# Patient Record
Sex: Male | Born: 1945 | Hispanic: No | Marital: Married | Smoking: Never smoker
Health system: Southern US, Community
[De-identification: ages and names within clinical notes are randomized; demographics above are authoritative.]

## PROBLEM LIST (undated history)

## (undated) DIAGNOSIS — I10 Essential (primary) hypertension: Secondary | ICD-10-CM

## (undated) DIAGNOSIS — E119 Type 2 diabetes mellitus without complications: Secondary | ICD-10-CM

## (undated) HISTORY — PX: PROSTATE SURGERY: SHX751

---

## 2014-03-08 ENCOUNTER — Encounter (HOSPITAL_COMMUNITY): Payer: Self-pay | Admitting: Family Medicine

## 2014-03-08 ENCOUNTER — Emergency Department (INDEPENDENT_AMBULATORY_CARE_PROVIDER_SITE_OTHER)
Admission: EM | Admit: 2014-03-08 | Discharge: 2014-03-08 | Disposition: A | Discharge status: Home / Self Care | Payer: PRIVATE HEALTH INSURANCE | Source: Home / Self Care | Attending: Family Medicine | Admitting: Family Medicine

## 2014-03-08 DIAGNOSIS — E119 Type 2 diabetes mellitus without complications: Secondary | ICD-10-CM

## 2014-03-08 DIAGNOSIS — M5431 Sciatica, right side: Secondary | ICD-10-CM

## 2014-03-08 HISTORY — DX: Type 2 diabetes mellitus without complications: E11.9

## 2014-03-08 HISTORY — DX: Essential (primary) hypertension: I10

## 2014-03-08 MED ORDER — METHOCARBAMOL 500 MG PO TABS
500.0000 mg | ORAL_TABLET | Freq: Four times a day (QID) | ORAL | Status: AC | PRN
Start: 2014-03-08 — End: ?

## 2014-03-08 MED ORDER — DEXAMETHASONE SODIUM PHOSPHATE 10 MG/ML IJ SOLN
10.0000 mg | Freq: Once | INTRAMUSCULAR | Status: AC
  Administered 2014-03-08: 10 mg via INTRAMUSCULAR

## 2014-03-08 MED ORDER — PREDNISONE (PAK) 10 MG PO TABS
ORAL_TABLET | ORAL | Status: DC
Start: 2014-03-08 — End: 2014-03-16

## 2014-03-08 MED ORDER — DEXAMETHASONE SODIUM PHOSPHATE 10 MG/ML IJ SOLN
INTRAMUSCULAR | Status: AC
  Filled 2014-03-08: qty 1

## 2014-03-08 NOTE — Discharge Instructions (Signed)
You likely have sciatica You were given a shot of steroids in the office to help with the inflammation and pain Please start your daily exercises, apply heat or ice, and try to massage your lower back Stay activeSciatica with Rehab The sciatic nerve runs from the back down the leg and is responsible for sensation and control of the muscles in the back (posterior) side of the thigh, lower leg, and foot. Sciatica is a condition that is characterized by inflammation of this nerve.  SYMPTOMS   Signs of nerve damage, including numbness and/or weakness along the posterior side of the lower extremity.  Pain in the back of the thigh that may also travel down the leg.  Pain that worsens when sitting for long periods of time.  Occasionally, pain in the back or buttock. CAUSES  Inflammation of the sciatic nerve is the cause of sciatica. The inflammation is due to something irritating the nerve. Common sources of irritation include:  Sitting for long periods of time.  Direct trauma to the nerve.  Arthritis of the spine.  Herniated or ruptured disk.  Slipping of the vertebrae (spondylolisthesis).  Pressure from soft tissues, such as muscles or ligament-like tissue (fascia). RISK INCREASES WITH:  Sports that place pressure or stress on the spine (football or weightlifting).  Poor strength and flexibility.  Failure to warm up properly before activity.  Family history of low back pain or disk disorders.  Previous back injury or surgery.  Poor body mechanics, especially when lifting, or poor posture. PREVENTION   Warm up and stretch properly before activity.  Maintain physical fitness:  Strength, flexibility, and endurance.  Cardiovascular fitness.  Learn and use proper technique, especially with posture and lifting. When possible, have coach correct improper technique.  Avoid activities that place stress on the spine. PROGNOSIS If treated properly, then sciatica usually  resolves within 6 weeks. However, occasionally surgery is necessary.  RELATED COMPLICATIONS   Permanent nerve damage, including pain, numbness, tingle, or weakness.  Chronic back pain.  Risks of surgery: infection, bleeding, nerve damage, or damage to surrounding tissues. TREATMENT Treatment initially involves resting from any activities that aggravate your symptoms. The use of ice and medication may help reduce pain and inflammation. The use of strengthening and stretching exercises may help reduce pain with activity. These exercises may be performed at home or with referral to a therapist. A therapist may recommend further treatments, such as transcutaneous electronic nerve stimulation (TENS) or ultrasound. Your caregiver may recommend corticosteroid injections to help reduce inflammation of the sciatic nerve. If symptoms persist despite non-surgical (conservative) treatment, then surgery may be recommended. MEDICATION  If pain medication is necessary, then nonsteroidal anti-inflammatory medications, such as aspirin and ibuprofen, or other minor pain relievers, such as acetaminophen, are often recommended.  Do not take pain medication for 7 days before surgery.  Prescription pain relievers may be given if deemed necessary by your caregiver. Use only as directed and only as much as you need.  Ointments applied to the skin may be helpful.  Corticosteroid injections may be given by your caregiver. These injections should be reserved for the most serious cases, because they may only be given a certain number of times. HEAT AND COLD  Cold treatment (icing) relieves pain and reduces inflammation. Cold treatment should be applied for 10 to 15 minutes every 2 to 3 hours for inflammation and pain and immediately after any activity that aggravates your symptoms. Use ice packs or massage the area with a piece  of ice (ice massage).  Heat treatment may be used prior to performing the stretching and  strengthening activities prescribed by your caregiver, physical therapist, or athletic trainer. Use a heat pack or soak the injury in warm water. SEEK MEDICAL CARE IF:  Treatment seems to offer no benefit, or the condition worsens.  Any medications produce adverse side effects. EXERCISES  RANGE OF MOTION (ROM) AND STRETCHING EXERCISES - Sciatica Most people with sciatic will find that their symptoms worsen with either excessive bending forward (flexion) or arching at the low back (extension). The exercises which will help resolve your symptoms will focus on the opposite motion. Your physician, physical therapist or athletic trainer will help you determine which exercises will be most helpful to resolve your low back pain. Do not complete any exercises without first consulting with your clinician. Discontinue any exercises which worsen your symptoms until you speak to your clinician. If you have pain, numbness or tingling which travels down into your buttocks, leg or foot, the goal of the therapy is for these symptoms to move closer to your back and eventually resolve. Occasionally, these leg symptoms will get better, but your low back pain may worsen; this is typically an indication of progress in your rehabilitation. Be certain to be very alert to any changes in your symptoms and the activities in which you participated in the 24 hours prior to the change. Sharing this information with your clinician will allow him/her to most efficiently treat your condition. These exercises may help you when beginning to rehabilitate your injury. Your symptoms may resolve with or without further involvement from your physician, physical therapist or athletic trainer. While completing these exercises, remember:   Restoring tissue flexibility helps normal motion to return to the joints. This allows healthier, less painful movement and activity.  An effective stretch should be held for at least 30 seconds.  A stretch  should never be painful. You should only feel a gentle lengthening or release in the stretched tissue. FLEXION RANGE OF MOTION AND STRETCHING EXERCISES: STRETCH - Flexion, Single Knee to Chest   Lie on a firm bed or floor with both legs extended in front of you.  Keeping one leg in contact with the floor, bring your opposite knee to your chest. Hold your leg in place by either grabbing behind your thigh or at your knee.  Pull until you feel a gentle stretch in your low back. Hold __________ seconds.  Slowly release your grasp and repeat the exercise with the opposite side. Repeat __________ times. Complete this exercise __________ times per day.  STRETCH - Flexion, Double Knee to Chest  Lie on a firm bed or floor with both legs extended in front of you.  Keeping one leg in contact with the floor, bring your opposite knee to your chest.  Tense your stomach muscles to support your back and then lift your other knee to your chest. Hold your legs in place by either grabbing behind your thighs or at your knees.  Pull both knees toward your chest until you feel a gentle stretch in your low back. Hold __________ seconds.  Tense your stomach muscles and slowly return one leg at a time to the floor. Repeat __________ times. Complete this exercise __________ times per day.  STRETCH - Low Trunk Rotation   Lie on a firm bed or floor. Keeping your legs in front of you, bend your knees so they are both pointed toward the ceiling and your feet are flat  on the floor.  Extend your arms out to the side. This will stabilize your upper body by keeping your shoulders in contact with the floor.  Gently and slowly drop both knees together to one side until you feel a gentle stretch in your low back. Hold for __________ seconds.  Tense your stomach muscles to support your low back as you bring your knees back to the starting position. Repeat the exercise to the other side. Repeat __________ times. Complete  this exercise __________ times per day  EXTENSION RANGE OF MOTION AND FLEXIBILITY EXERCISES: STRETCH - Extension, Prone on Elbows  Lie on your stomach on the floor, a bed will be too soft. Place your palms about shoulder width apart and at the height of your head.  Place your elbows under your shoulders. If this is too painful, stack pillows under your chest.  Allow your body to relax so that your hips drop lower and make contact more completely with the floor.  Hold this position for __________ seconds.  Slowly return to lying flat on the floor. Repeat __________ times. Complete this exercise __________ times per day.  RANGE OF MOTION - Extension, Prone Press Ups  Lie on your stomach on the floor, a bed will be too soft. Place your palms about shoulder width apart and at the height of your head.  Keeping your back as relaxed as possible, slowly straighten your elbows while keeping your hips on the floor. You may adjust the placement of your hands to maximize your comfort. As you gain motion, your hands will come more underneath your shoulders.  Hold this position __________ seconds.  Slowly return to lying flat on the floor. Repeat __________ times. Complete this exercise __________ times per day.  STRENGTHENING EXERCISES - Sciatica  These exercises may help you when beginning to rehabilitate your injury. These exercises should be done near your "sweet spot." This is the neutral, low-back arch, somewhere between fully rounded and fully arched, that is your least painful position. When performed in this safe range of motion, these exercises can be used for people who have either a flexion or extension based injury. These exercises may resolve your symptoms with or without further involvement from your physician, physical therapist or athletic trainer. While completing these exercises, remember:   Muscles can gain both the endurance and the strength needed for everyday activities through  controlled exercises.  Complete these exercises as instructed by your physician, physical therapist or athletic trainer. Progress with the resistance and repetition exercises only as your caregiver advises.  You may experience muscle soreness or fatigue, but the pain or discomfort you are trying to eliminate should never worsen during these exercises. If this pain does worsen, stop and make certain you are following the directions exactly. If the pain is still present after adjustments, discontinue the exercise until you can discuss the trouble with your clinician. STRENGTHENING - Deep Abdominals, Pelvic Tilt   Lie on a firm bed or floor. Keeping your legs in front of you, bend your knees so they are both pointed toward the ceiling and your feet are flat on the floor.  Tense your lower abdominal muscles to press your low back into the floor. This motion will rotate your pelvis so that your tail bone is scooping upwards rather than pointing at your feet or into the floor.  With a gentle tension and even breathing, hold this position for __________ seconds. Repeat __________ times. Complete this exercise __________ times per day.  STRENGTHENING - Abdominals, Crunches   Lie on a firm bed or floor. Keeping your legs in front of you, bend your knees so they are both pointed toward the ceiling and your feet are flat on the floor. Cross your arms over your chest.  Slightly tip your chin down without bending your neck.  Tense your abdominals and slowly lift your trunk high enough to just clear your shoulder blades. Lifting higher can put excessive stress on the low back and does not further strengthen your abdominal muscles.  Control your return to the starting position. Repeat __________ times. Complete this exercise __________ times per day.  STRENGTHENING - Quadruped, Opposite UE/LE Lift  Assume a hands and knees position on a firm surface. Keep your hands under your shoulders and your knees  under your hips. You may place padding under your knees for comfort.  Find your neutral spine and gently tense your abdominal muscles so that you can maintain this position. Your shoulders and hips should form a rectangle that is parallel with the floor and is not twisted.  Keeping your trunk steady, lift your right hand no higher than your shoulder and then your left leg no higher than your hip. Make sure you are not holding your breath. Hold this position __________ seconds.  Continuing to keep your abdominal muscles tense and your back steady, slowly return to your starting position. Repeat with the opposite arm and leg. Repeat __________ times. Complete this exercise __________ times per day.  STRENGTHENING - Abdominals and Quadriceps, Straight Leg Raise   Lie on a firm bed or floor with both legs extended in front of you.  Keeping one leg in contact with the floor, bend the other knee so that your foot can rest flat on the floor.  Find your neutral spine, and tense your abdominal muscles to maintain your spinal position throughout the exercise.  Slowly lift your straight leg off the floor about 6 inches for a count of 15, making sure to not hold your breath.  Still keeping your neutral spine, slowly lower your leg all the way to the floor. Repeat this exercise with each leg __________ times. Complete this exercise __________ times per day. POSTURE AND BODY MECHANICS CONSIDERATIONS - Sciatica Keeping correct posture when sitting, standing or completing your activities will reduce the stress put on different body tissues, allowing injured tissues a chance to heal and limiting painful experiences. The following are general guidelines for improved posture. Your physician or physical therapist will provide you with any instructions specific to your needs. While reading these guidelines, remember:  The exercises prescribed by your provider will help you have the flexibility and strength to  maintain correct postures.  The correct posture provides the optimal environment for your joints to work. All of your joints have less wear and tear when properly supported by a spine with good posture. This means you will experience a healthier, less painful body.  Correct posture must be practiced with all of your activities, especially prolonged sitting and standing. Correct posture is as important when doing repetitive low-stress activities (typing) as it is when doing a single heavy-load activity (lifting). RESTING POSITIONS Consider which positions are most painful for you when choosing a resting position. If you have pain with flexion-based activities (sitting, bending, stooping, squatting), choose a position that allows you to rest in a less flexed posture. You would want to avoid curling into a fetal position on your side. If your pain worsens with extension-based activities (  prolonged standing, working overhead), avoid resting in an extended position such as sleeping on your stomach. Most people will find more comfort when they rest with their spine in a more neutral position, neither too rounded nor too arched. Lying on a non-sagging bed on your side with a pillow between your knees, or on your back with a pillow under your knees will often provide some relief. Keep in mind, being in any one position for a prolonged period of time, no matter how correct your posture, can still lead to stiffness. PROPER SITTING POSTURE In order to minimize stress and discomfort on your spine, you must sit with correct posture Sitting with good posture should be effortless for a healthy body. Returning to good posture is a gradual process. Many people can work toward this most comfortably by using various supports until they have the flexibility and strength to maintain this posture on their own. When sitting with proper posture, your ears will fall over your shoulders and your shoulders will fall over your hips.  You should use the back of the chair to support your upper back. Your low back will be in a neutral position, just slightly arched. You may place a small pillow or folded towel at the base of your low back for support.  When working at a desk, create an environment that supports good, upright posture. Without extra support, muscles fatigue and lead to excessive strain on joints and other tissues. Keep these recommendations in mind: CHAIR:   A chair should be able to slide under your desk when your back makes contact with the back of the chair. This allows you to work closely.  The chair's height should allow your eyes to be level with the upper part of your monitor and your hands to be slightly lower than your elbows. BODY POSITION  Your feet should make contact with the floor. If this is not possible, use a foot rest.  Keep your ears over your shoulders. This will reduce stress on your neck and low back. INCORRECT SITTING POSTURES   If you are feeling tired and unable to assume a healthy sitting posture, do not slouch or slump. This puts excessive strain on your back tissues, causing more damage and pain. Healthier options include:  Using more support, like a lumbar pillow.  Switching tasks to something that requires you to be upright or walking.  Talking a brief walk.  Lying down to rest in a neutral-spine position. PROLONGED STANDING WHILE SLIGHTLY LEANING FORWARD  When completing a task that requires you to lean forward while standing in one place for a long time, place either foot up on a stationary 2-4 inch high object to help maintain the best posture. When both feet are on the ground, the low back tends to lose its slight inward curve. If this curve flattens (or becomes too large), then the back and your other joints will experience too much stress, fatigue more quickly and can cause pain.  CORRECT STANDING POSTURES Proper standing posture should be assumed with all daily  activities, even if they only take a few moments, like when brushing your teeth. As in sitting, your ears should fall over your shoulders and your shoulders should fall over your hips. You should keep a slight tension in your abdominal muscles to brace your spine. Your tailbone should point down to the ground, not behind your body, resulting in an over-extended swayback posture.  INCORRECT STANDING POSTURES  Common incorrect standing postures include a  forward head, locked knees and/or an excessive swayback. WALKING Walk with an upright posture. Your ears, shoulders and hips should all line-up. PROLONGED ACTIVITY IN A FLEXED POSITION When completing a task that requires you to bend forward at your waist or lean over a low surface, try to find a way to stabilize 3 of 4 of your limbs. You can place a hand or elbow on your thigh or rest a knee on the surface you are reaching across. This will provide you more stability so that your muscles do not fatigue as quickly. By keeping your knees relaxed, or slightly bent, you will also reduce stress across your low back. CORRECT LIFTING TECHNIQUES DO :   Assume a wide stance. This will provide you more stability and the opportunity to get as close as possible to the object which you are lifting.  Tense your abdominals to brace your spine; then bend at the knees and hips. Keeping your back locked in a neutral-spine position, lift using your leg muscles. Lift with your legs, keeping your back straight.  Test the weight of unknown objects before attempting to lift them.  Try to keep your elbows locked down at your sides in order get the best strength from your shoulders when carrying an object.  Always ask for help when lifting heavy or awkward objects. INCORRECT LIFTING TECHNIQUES DO NOT:   Lock your knees when lifting, even if it is a small object.  Bend and twist. Pivot at your feet or move your feet when needing to change directions.  Assume that you  cannot safely pick up a paperclip without proper posture. Document Released: 04/13/2005 Document Revised: 08/28/2013 Document Reviewed: 07/26/2008 [facility] Patient Information 2015 [place], [place]. This information is not intended to replace advice given to you by your health care provider. Make sure you discuss any questions you have with your health care provider. If you get significantly worse please seek medical attention at the emergency room Please delay your flight until this improves Please check your sugar regularly and increase your lantus by 1-5 units as needed for elevated sugars. Goal for your sugar should be between 100-200.  Please stop the the prednisone if your sugar gets above 300

## 2014-03-08 NOTE — ED Provider Notes (Signed)
CSN: [id number]     Arrival date & time 03/08/14  [id number] History   First MD Initiated Contact with Patient 03/08/14 1018     No chief complaint on file.  (Consider location/radiation/quality/duration/timing/severity/associated sxs/prior Treatment) HPI  [place] tonight. Tailbone pain. Started this morning when pt woke up. Associated w/ pain and stiffness radiating down R buttock to leg. Denies leg swelling. Feels weak in the legs and is unable to stand secondary to pain. No change in sensation or loss of bowel or bladder function. New pain for pt. No change recently in physical activity or recent falls.   Past Medical History  Diagnosis Date  . Diabetes mellitus without complication   . Hypertension    Past Surgical History  Procedure Laterality Date  . Prostate surgery     Family History  Problem Relation Age of Onset  . Diabetes Mother   . Hypertension Father   . Stroke Father    History  Substance Use Topics  . Smoking status: Never Smoker   . Smokeless tobacco: Not on file  . Alcohol Use: Yes     Comment: occ    Review of Systems Per HPI with all other pertinent systems negative.   Allergies  Review of patient's allergies indicates not on file.  Home Medications   Prior to Admission medications   Medication Sig Start Date End Date Taking? Authorizing Provider  methocarbamol (ROBAXIN) 500 MG tablet Take 1-2 tablets (500-1,000 mg total) by mouth every 6 (six) hours as needed for muscle spasms. 03/08/14   Ozella Rocksavid J Daleisa Halperin, MD  predniSONE (STERAPRED UNI-PAK) 10 MG tablet Take 60mg  by mouth daily with breakfast 03/08/14   Ozella Rocksavid J Takila Kronberg, MD   BP 131/84 mmHg  Pulse 106  Temp(Src) 98.6 F (37 C) (Oral)  Resp 12  SpO2 100% Physical Exam  Constitutional: He appears well-developed and well-nourished. No distress.  HENT:  Head: Normocephalic and atraumatic.  Eyes: EOM are normal. Pupils are equal, round, and reactive to light.   Neck: Normal range of motion.  Cardiovascular: Normal rate, normal heart sounds and intact distal pulses.   Pulmonary/Chest: Effort normal and breath sounds normal.  Abdominal: Soft.  Musculoskeletal:  R lower thoracic to lumbar perispinal tightness w/o ttp and no bony ttp. R leg flexion 5/5 and L 5/5. FROM of back and LE bilat. Sensation intact.   Skin: He is not diaphoretic.    ED Course  Procedures (including critical care time) Labs Review Labs Reviewed - No data to display  Imaging Review No results found.   MDM   1. Sciatica, right   2. Diabetes mellitus type 2 in nonobese    Decadron 10mg  IM in office Start prednisone at home tomorrow x 6 more days Robaxin Exercises, heat/ice, massage.  Pt to monitor sugars closely. Currently CBG 75 avg in am. Titrate lantus as needed 1-5 units to keep glucose in the 100-200 range fasting in the am. Stop prednisone if >200 and symptomatic.  Precautions given and all questions answered  Shelly Flattenavid Daina Cara, MD Family Medicine 03/08/2014, 11:14 AM  \     Ozella Rocksavid J Kewanna Kasprzak, MD 03/08/14 1115

## 2014-03-08 NOTE — ED Notes (Signed)
68 year old male brought to Urgent care by his family.  He is complaining of severe  Lower back pain.

## 2014-03-16 ENCOUNTER — Emergency Department (INDEPENDENT_AMBULATORY_CARE_PROVIDER_SITE_OTHER)
Admission: EM | Admit: 2014-03-16 | Discharge: 2014-03-16 | Disposition: A | Discharge status: Home / Self Care | Payer: PRIVATE HEALTH INSURANCE | Source: Home / Self Care | Attending: Family Medicine | Admitting: Family Medicine

## 2014-03-16 ENCOUNTER — Emergency Department (HOSPITAL_COMMUNITY): Payer: PRIVATE HEALTH INSURANCE

## 2014-03-16 ENCOUNTER — Emergency Department (HOSPITAL_COMMUNITY)
Admission: EM | Admit: 2014-03-16 | Discharge: 2014-03-16 | Disposition: A | Discharge status: Home / Self Care | Payer: PRIVATE HEALTH INSURANCE | Attending: Emergency Medicine | Admitting: Emergency Medicine

## 2014-03-16 ENCOUNTER — Encounter (HOSPITAL_COMMUNITY): Payer: Self-pay | Admitting: Emergency Medicine

## 2014-03-16 ENCOUNTER — Encounter (HOSPITAL_COMMUNITY): Payer: Self-pay | Admitting: Family Medicine

## 2014-03-16 DIAGNOSIS — E119 Type 2 diabetes mellitus without complications: Secondary | ICD-10-CM | POA: Insufficient documentation

## 2014-03-16 DIAGNOSIS — I10 Essential (primary) hypertension: Secondary | ICD-10-CM | POA: Diagnosis not present

## 2014-03-16 DIAGNOSIS — M549 Dorsalgia, unspecified: Secondary | ICD-10-CM | POA: Diagnosis not present

## 2014-03-16 DIAGNOSIS — Z79899 Other long term (current) drug therapy: Secondary | ICD-10-CM | POA: Insufficient documentation

## 2014-03-16 DIAGNOSIS — M5126 Other intervertebral disc displacement, lumbar region: Secondary | ICD-10-CM

## 2014-03-16 DIAGNOSIS — R29898 Other symptoms and signs involving the musculoskeletal system: Secondary | ICD-10-CM

## 2014-03-16 DIAGNOSIS — R531 Weakness: Secondary | ICD-10-CM

## 2014-03-16 DIAGNOSIS — M5186 Other intervertebral disc disorders, lumbar region: Secondary | ICD-10-CM | POA: Diagnosis not present

## 2014-03-16 DIAGNOSIS — Z7952 Long term (current) use of systemic steroids: Secondary | ICD-10-CM | POA: Insufficient documentation

## 2014-03-16 DIAGNOSIS — M5432 Sciatica, left side: Secondary | ICD-10-CM

## 2014-03-16 LAB — COMPREHENSIVE METABOLIC PANEL
ALK PHOS: 69 U/L (ref 39–117)
ALT: 21 U/L (ref 0–53)
AST: 16 U/L (ref 0–37)
Albumin: 3.5 g/dL (ref 3.5–5.2)
Anion gap: 17 — ABNORMAL HIGH (ref 5–15)
BUN: 16 mg/dL (ref 6–23)
CO2: 20 mEq/L (ref 19–32)
Calcium: 9 mg/dL (ref 8.4–10.5)
Chloride: 87 mEq/L — ABNORMAL LOW (ref 96–112)
Creatinine, Ser: 0.69 mg/dL (ref 0.50–1.35)
GFR calc Af Amer: >90 mL/min (ref >90)
GFR calc non Af Amer: >90 mL/min (ref >90)
Glucose, Bld: 194 mg/dL — ABNORMAL HIGH (ref 70–99)
POTASSIUM: 4.1 meq/L (ref 3.7–5.3)
SODIUM: 124 meq/L — AB (ref 137–147)
Total Bilirubin: 0.7 mg/dL (ref 0.3–1.2)
Total Protein: 6.4 g/dL (ref 6.0–8.3)

## 2014-03-16 LAB — CBC WITH DIFFERENTIAL/PLATELET
BASOS ABS: 0 10*3/uL (ref 0.0–0.1)
Basophils Relative: 0 % (ref 0–1)
EOS ABS: 0.1 10*3/uL (ref 0.0–0.7)
Eosinophils Relative: 1 % (ref 0–5)
HCT: 36.1 % — ABNORMAL LOW (ref 39.0–52.0)
Hemoglobin: 12.7 g/dL — ABNORMAL LOW (ref 13.0–17.0)
Lymphocytes Relative: 25 % (ref 12–46)
Lymphs Abs: 2.1 10*3/uL (ref 0.7–4.0)
MCH: 29.5 pg (ref 26.0–34.0)
MCHC: 35.2 g/dL (ref 30.0–36.0)
MCV: 84 fL (ref 78.0–100.0)
Monocytes Absolute: 0.6 10*3/uL (ref 0.1–1.0)
Monocytes Relative: 7 % (ref 3–12)
NEUTROS PCT: 67 % (ref 43–77)
Neutro Abs: 5.8 10*3/uL (ref 1.7–7.7)
PLATELETS: 248 10*3/uL (ref 150–400)
RBC: 4.3 MIL/uL (ref 4.22–5.81)
RDW: 13 % (ref 11.5–15.5)
WBC: 8.6 10*3/uL (ref 4.0–10.5)

## 2014-03-16 LAB — CBG MONITORING, ED: Glucose-Capillary: 136 mg/dL — ABNORMAL HIGH (ref 70–99)

## 2014-03-16 MED ORDER — SODIUM CHLORIDE 0.9 % IV BOLUS (SEPSIS)
1000.0000 mL | Freq: Once | INTRAVENOUS | Status: AC
  Administered 2014-03-16: 1000 mL via INTRAVENOUS

## 2014-03-16 MED ORDER — PREDNISONE 20 MG PO TABS: 20.0000 mg | ORAL_TABLET | Freq: Every day | ORAL | Status: AC

## 2014-03-16 MED ORDER — METHYLPREDNISOLONE SODIUM SUCC 125 MG IJ SOLR
125.0000 mg | Freq: Once | INTRAMUSCULAR | Status: AC
  Administered 2014-03-16: 125 mg via INTRAVENOUS
  Filled 2014-03-16: qty 2

## 2014-03-16 MED ORDER — OXYCODONE-ACETAMINOPHEN 5-325 MG PO TABS: 1.0000 | ORAL_TABLET | Freq: Four times a day (QID) | ORAL | Status: AC | PRN

## 2014-03-16 MED ORDER — ONDANSETRON 4 MG PO TBDP: ORAL_TABLET | ORAL | Status: AC

## 2014-03-16 NOTE — ED Provider Notes (Signed)
CSN: [id number]     Arrival date & time 03/16/14  1744 History   First MD Initiated Contact with Patient 03/16/14 1819     Chief Complaint  Patient presents with  . Extremity Weakness     (Consider location/radiation/quality/duration/timing/severity/associated sxs/prior Treatment) Patient is a 68 y.o. male presenting with extremity weakness. The history is provided by the patient (the pt states he has had back pain and weakness in left leg for 12 days.  he was started on prednisone for 6 days and got some better, but now it is worse again.   the pt was seen at urgent care and sent here for and mri).  Extremity Weakness This is a recurrent problem. The current episode started more than 1 week ago. The problem occurs constantly. The problem has not changed since onset.Pertinent negatives include no chest pain, no abdominal pain and no headaches. Nothing aggravates the symptoms. Nothing relieves the symptoms.    Past Medical History  Diagnosis Date  . Diabetes mellitus without complication   . Hypertension    Past Surgical History  Procedure Laterality Date  . Prostate surgery     Family History  Problem Relation Age of Onset  . Diabetes Mother   . Hypertension Father   . Stroke Father    History  Substance Use Topics  . Smoking status: Never Smoker   . Smokeless tobacco: Not on file  . Alcohol Use: Yes     Comment: occ    Review of Systems  Constitutional: Negative for appetite change and fatigue.  HENT: Negative for congestion, ear discharge and sinus pressure.   Eyes: Negative for discharge.  Respiratory: Negative for cough.   Cardiovascular: Negative for chest pain.  Gastrointestinal: Negative for abdominal pain and diarrhea.  Genitourinary: Negative for frequency and hematuria.  Musculoskeletal: Positive for back pain and extremity weakness.  Skin: Negative for rash.  Neurological: Positive for weakness. Negative for seizures and headaches.       Weakness left leg   Psychiatric/Behavioral: Negative for hallucinations.      Allergies  Review of patient's allergies indicates no known allergies.  Home Medications   Prior to Admission medications   Medication Sig Start Date End Date Taking? Authorizing Provider  Insulin Glargine (LANTUS SOLOSTAR) 100 UNIT/ML Solostar Pen Inject 8-11 Units into the skin at bedtime. (dose increased due to prednisone)   Yes Historical Provider, MD  METFORMIN HCL PO Take 1 tablet by mouth 2 (two) times daily.   Yes Historical Provider, MD  methocarbamol (ROBAXIN) 500 MG tablet Take 1-2 tablets (500-1,000 mg total) by mouth every 6 (six) hours as needed for muscle spasms. 03/08/14   Ozella Rocksavid J Merrell, MD  oxyCODONE-acetaminophen (PERCOCET/ROXICET) 5-325 MG per tablet Take 1 tablet by mouth every 6 (six) hours as needed for severe pain. 03/16/14   Benny LennertJoseph L Valisa Karpel, MD  predniSONE (DELTASONE) 20 MG tablet Take 1 tablet (20 mg total) by mouth daily. 03/16/14   Benny LennertJoseph L Korinne Greenstein, MD   BP 98/68 mmHg  Pulse 88  Temp(Src) 98.3 F (36.8 C)  Resp 15  SpO2 98% Physical Exam  Constitutional: He is oriented to person, place, and time. He appears well-developed.  HENT:  Head: Normocephalic.  Eyes: Conjunctivae and EOM are normal. No scleral icterus.  Neck: Neck supple. No thyromegaly present.  Cardiovascular: Normal rate and regular rhythm.  Exam reveals no gallop and no friction rub.   No murmur heard. Pulmonary/Chest: No stridor. He has no wheezes. He has no rales. He  exhibits no tenderness.  Abdominal: He exhibits no distension. There is no tenderness. There is no rebound.  Musculoskeletal: He exhibits no edema.  Tender lumbar spine moderate  Lymphadenopathy:    He has no cervical adenopathy.  Neurological: He is oriented to person, place, and time. He has normal reflexes. He exhibits normal muscle tone. Coordination abnormal.  Pt has moderate weakness in left lower leg with decrease coordination.  He has positive straight leg  raise  Skin: No rash noted. No erythema.  Psychiatric: He has a normal mood and affect. His behavior is normal.    ED Course  Procedures (including critical care time) Labs Review Labs Reviewed  CBC WITH DIFFERENTIAL - Abnormal; Notable for the following:    Hemoglobin 12.7 (*)    HCT 36.1 (*)    All other components within normal limits  COMPREHENSIVE METABOLIC PANEL - Abnormal; Notable for the following:    Sodium 124 (*)    Chloride 87 (*)    Glucose, Bld 194 (*)    Anion gap 17 (*)    All other components within normal limits  CBG MONITORING, ED - Abnormal; Notable for the following:    Glucose-Capillary 136 (*)    All other components within normal limits    Imaging Review Ct Head Wo Contrast  03/16/2014   CLINICAL DATA:  Ongoing leg weakness for past few weeks. Problems standing this week.  EXAM: CT HEAD WITHOUT CONTRAST  TECHNIQUE: Contiguous axial images were obtained from the base of the skull through the vertex without intravenous contrast.  COMPARISON:  None.  FINDINGS: Mild volume loss/atrophy which may be slightly age advanced. No acute intracranial abnormality. Specifically, no hemorrhage, hydrocephalus, mass lesion, acute infarction, or significant intracranial injury. No acute calvarial abnormality. Visualized paranasal sinuses and mastoids clear. Orbital soft tissues unremarkable.  IMPRESSION: Mild age advanced atrophy.  No acute intracranial abnormality.   Electronically Signed   By: Charlett NoseKevin  Dover M.D.   On: 03/16/2014 18:52   Mr Lumbar Spine Wo Contrast  03/16/2014   CLINICAL DATA:  Initial evaluation for left leg weakness and leg pain.  EXAM: MRI LUMBAR SPINE WITHOUT CONTRAST  TECHNIQUE: Multiplanar, multisequence MR imaging of the lumbar spine was performed. No intravenous contrast was administered.  COMPARISON:  None available.  FINDINGS: For the purposes of this dictation, the lowest well-formed intervertebral disc spaces presumed to be the L5-S1 level, and there  presumed to be 5 lumbar type vertebral bodies.  The vertebral bodies are normally aligned with preservation of the normal lumbar lordosis. Vertebral body heights are maintained. Signal intensity within the vertebral body bone marrow is normal. No focal osseous lesion.  Conus medullaris terminates normally at the L1 level. Signal intensity within the visualized cord is within normal limits.  Paraspinous soft tissues are unremarkable.  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  T12-L1: Mild degenerative disc desiccation without significant disc bulge. No focal disc protrusion. Mild anterior osteophytic endplate spurring present at this level. No significant canal or foraminal stenosis.  L1-2: Mild disc desiccation with disc bulge. No focal disc protrusion. Mild bilateral facet hypertrophy. No canal or foraminal stenosis  L2-3: Disc bulge with disc desiccation. Superimposed left paracentral disc protrusion with associated inferior migration. Inferior migration measures 8 mm. The protruding disc indents and encroaches upon the left lateral recess, likely impinging upon the transiting left L3 nerve root (series 6, image 13). Mild bilateral facet hypertrophy present at this level as well. There is  resultant moderate to severe canal stenosis with mild left foraminal narrowing. Right neural foramen remains widely patent.  L3-4: Disc bulge with disc desiccation present. Associated degenerative endplate changes present. There is moderate bilateral facet hypertrophy with ligamentum flavum thickening. A superimposed left paracentral disc protrusion is present, best appreciated on sagittal STIR sequence (Series 5, image 7). There is associated slight inferior migration measuring approximately 4-5 mm. Protruding disc extends into the right lateral recess, likely impinging upon the transiting left L4 nerve root (series 6, image 20). These changes superimposed on short pedicles results in  fairly severe canal stenosis. Bulging disc abuts the right L3 nerve root as well in the right neural foramen (series 4, image 4). There is moderate bilateral foraminal stenosis, left slightly worse than right.  L4-5: Disc desiccation with mild diffuse disc bulge present. No focal disc protrusion. Moderate bilateral facet arthrosis. Changes superimposed on short pedicles results in mild canal and bilateral foraminal narrowing.  L5-S1: Disc bulge with disc desiccation. No focal disc protrusion. Moderate bilateral facet arthrosis, right slightly worse than left. No significant central canal stenosis. Foramina remain widely patent.  IMPRESSION: 1. 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 The protruding disc encroaches upon the left lateral recess and likely impinges upon the transiting left L3 nerve root. 2. Left paracentral disc protrusion with slight inferior migration at L3-4, which superimposed on short pedicles and facet hypertrophy results in severe canal and moderate bilateral foraminal stenosis, left slightly greater than right. This protruding disc extends into the left lateral recess and likely impinges upon the transiting left L4 nerve root. 3. Additional mild multilevel degenerative changes as above. 4. 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   Electronically Signed   By: Rise Mu M.D.   On: 03/16/2014 21:25     EKG Interpretation None      MDM   Final diagnoses:  Leg weakness  Left leg weakness  Ruptured lumbar disc    I spoke with Neurosurgeon Dr. Delma Officer and he recommended pain meds, steroids and rest with follow up next week...      The pt agreed with the treatment plan.   And will follow up next week.   Benny Lennert, MD 03/16/14 2224

## 2014-03-16 NOTE — ED Notes (Signed)
Patient returned from CT

## 2014-03-16 NOTE — ED Provider Notes (Signed)
Samuel Freeman is a 68 y.o. male who presents to Urgent Care today for weakness and leg pain. Patient was seen on November 12 for sciatica pain down his left leg. He was treated with prednisone. Since then his pain has continued and he has developed worsening weakness of his left leg associated with numbness. He denies any urinary or fecal incontinence but notes increasing difficulty initiating urination. He is having trouble standing without support now and has had several falls at home.  [place] is traveling from [place] and was scheduled to return on November 13.     Past Medical History  Diagnosis Date  . Diabetes mellitus without complication   . Hypertension    Past Surgical History  Procedure Laterality Date  . Prostate surgery     History  Substance Use Topics  . Smoking status: Never Smoker   . Smokeless tobacco: Not on file  . Alcohol Use: Yes     Comment: occ   ROS as above Medications: No current facility-administered medications for this encounter.   Current Outpatient Prescriptions  Medication Sig Dispense Refill  . methocarbamol (ROBAXIN) 500 MG tablet Take 1-2 tablets (500-1,000 mg total) by mouth every 6 (six) hours as needed for muscle spasms. 60 tablet 0  . predniSONE (STERAPRED UNI-PAK) 10 MG tablet Take 60mg  by mouth daily with breakfast 36 tablet 0   Not on File   Exam:  BP 134/70 mmHg  Pulse 115  Temp(Src) 99.2 F (37.3 C) (Oral)  Resp 16  SpO2 100% Gen: Well NAD Left leg: Reflexes are diminished but equal bilateral knees. Sensation is subjectively decreased in the left compared to the right. Patient is unable to stand without assistance.  No results found for this or any previous visit (from the past 24 hour(s)). No results found.  Assessment and Plan: 68 y.o. male with sciatica. Patient has continued left leg pain but has new numbness and weakness. This is highly concerning for severe compression of one of his lumbar nerve roots. Plan to transfer to  the emergency department. Concern for developing cauda equina syndrome.   Discussed warning signs or symptoms. Please see discharge instructions. Patient expresses understanding.     Rodolph BongEvan S Renly Roots, MD 03/16/14 [phone]

## 2014-03-16 NOTE — ED Notes (Signed)
Patient transported to CT 

## 2014-03-16 NOTE — Discharge Instructions (Signed)
Follow up with Dr. Delma OfficerJeff Jenkins or partners next week.  Call Monday morning and tell them he was seen here Friday and we spoke with Dr. Lovell SheehanJenkins

## 2014-03-16 NOTE — Discharge Instructions (Signed)
Thank you for coming in today. °Go directly to the emergency room °

## 2014-03-16 NOTE — ED Notes (Signed)
Per pt family pt has been having weakness in legs to the point he cannont stand over the past week. sts progressively worse with left foot drop. Sent here to R/O cauda equina. Pt also having left leg pain,.

## 2014-03-16 NOTE — ED Notes (Signed)
Pt daughter states that was here last week and he pain and weakness has gotten worse. Pt daughter wants follow up

## 2016-01-04 IMAGING — CT CT HEAD W/O CM
1 series · 16 of 30 positions shown, 20 images · non-contrast
Comparison: None.

CLINICAL DATA: Ongoing leg weakness for past few weeks. Problems
standing this week.

EXAM:
CT HEAD WITHOUT CONTRAST
TECHNIQUE: Contiguous axial images were obtained from the base of the skull
through the vertex without intravenous contrast.

[Series 2: head 5.0 h30s · axial · 0.45mm/px · z∈[+1307,+1442]mm · 16 of 30 slices shown, 20 images]
[im 2/30  brain]
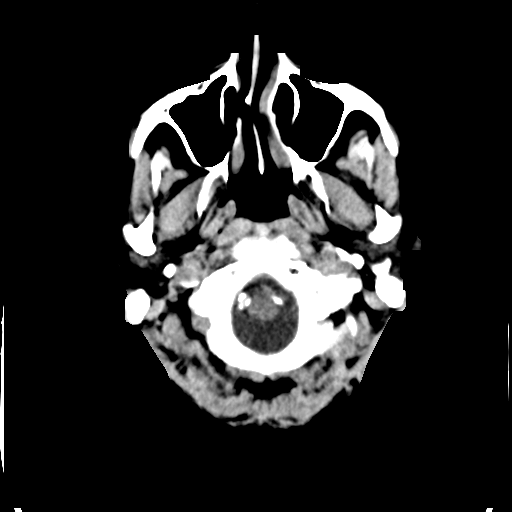
[im 2/30  bone]
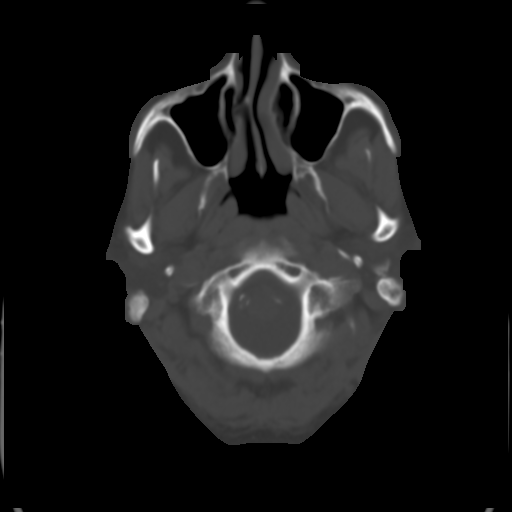
[im 4/30  brain]
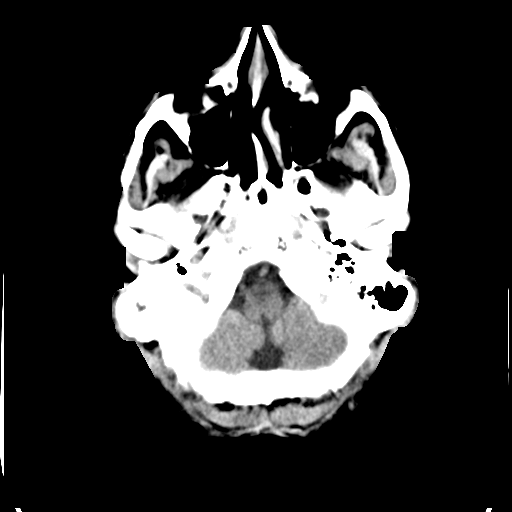
[im 6/30  brain]
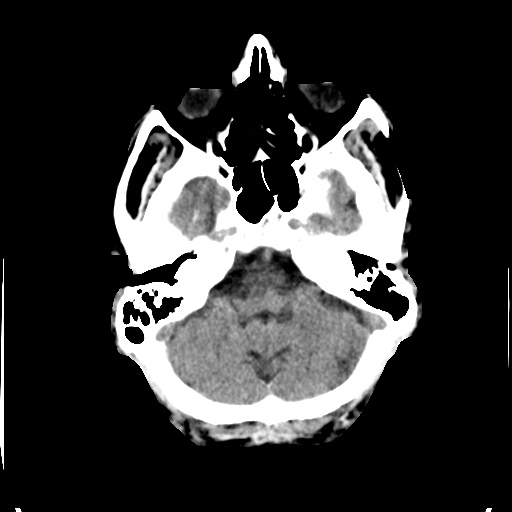
[im 8/30  brain]
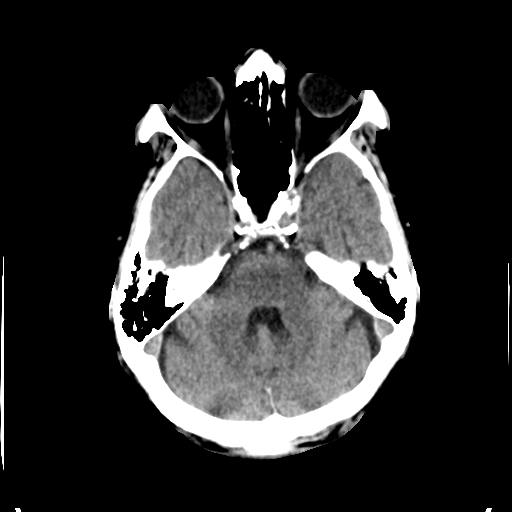
[im 9/30  brain]
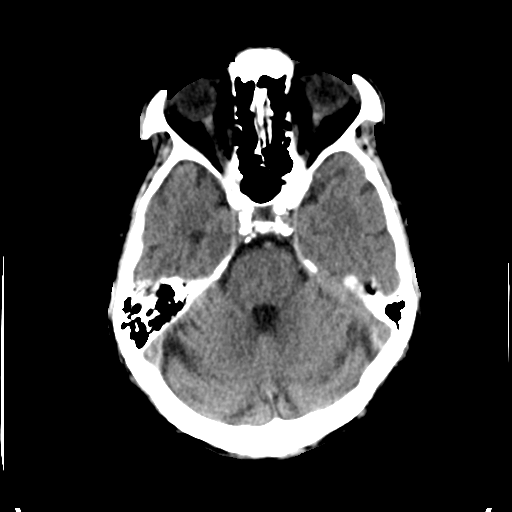
[im 9/30  bone]
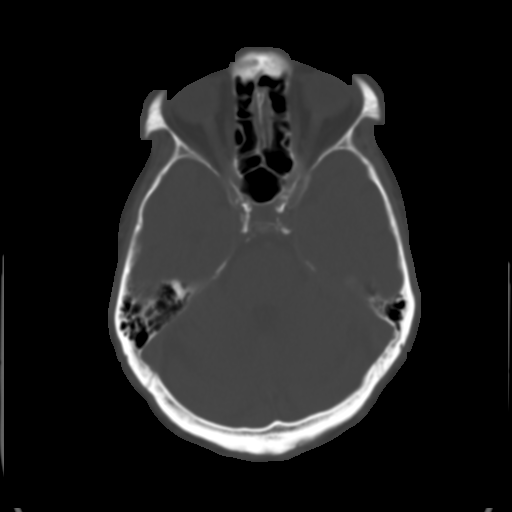
[im 11/30  brain]
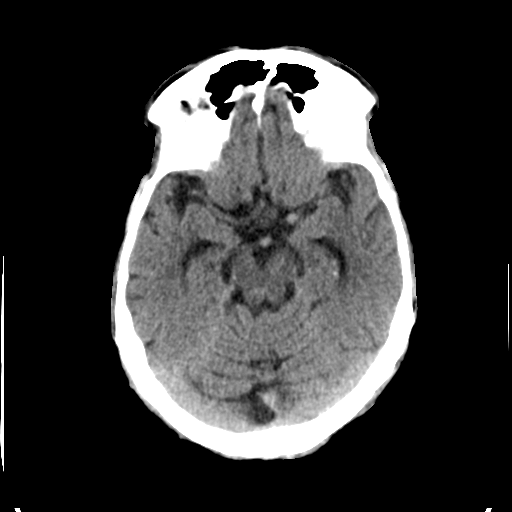
[im 13/30  brain]
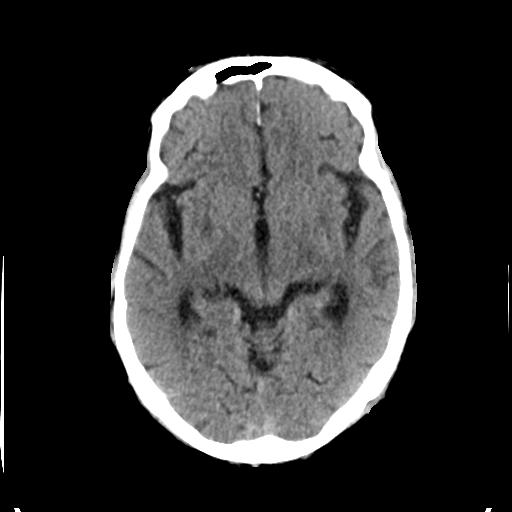
[im 15/30  brain]
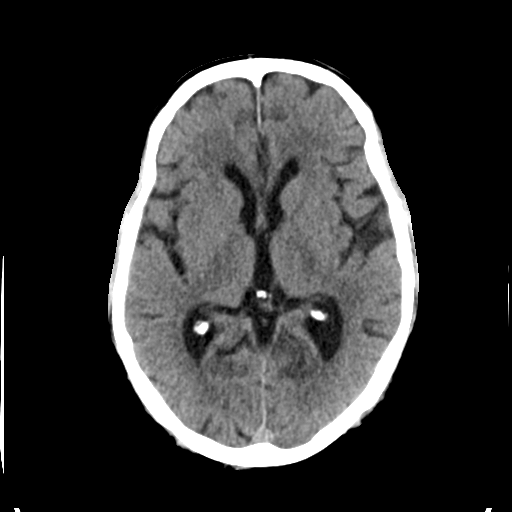
[im 16/30  brain]
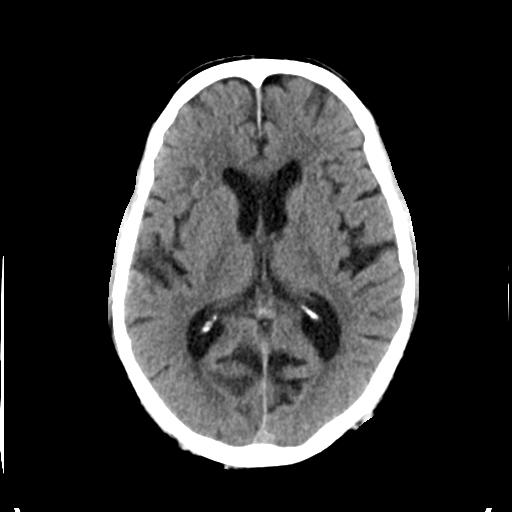
[im 16/30  bone]
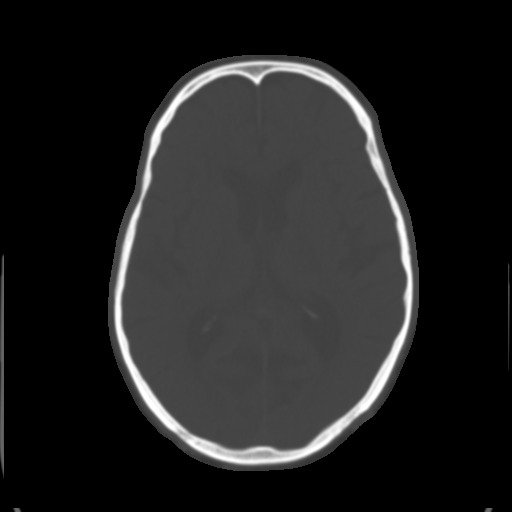
[im 18/30  brain]
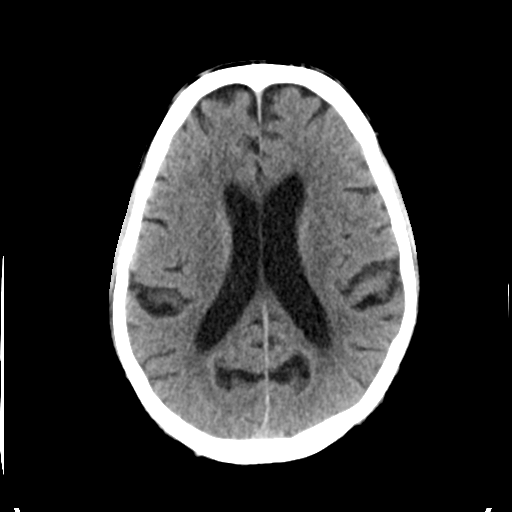
[im 20/30  brain]
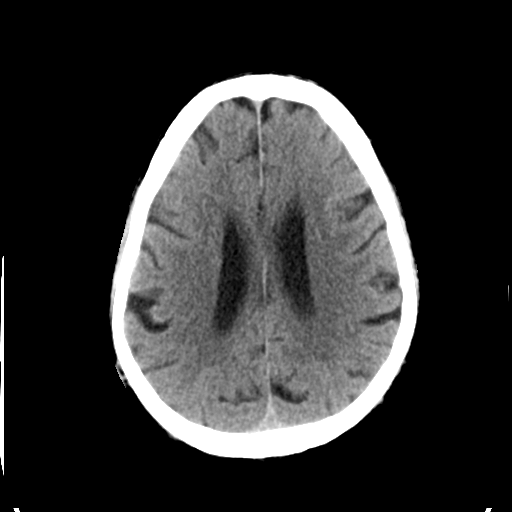
[im 22/30  brain]
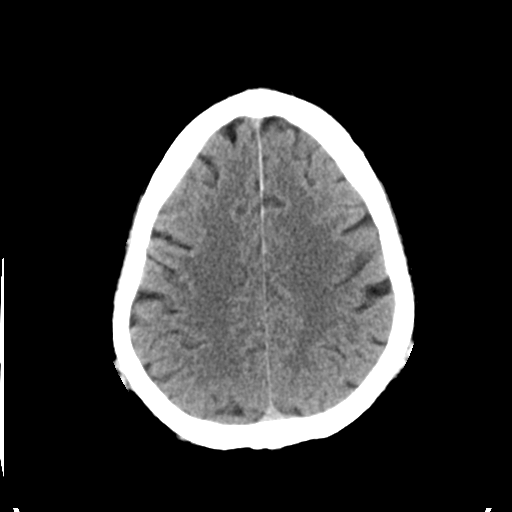
[im 23/30  brain]
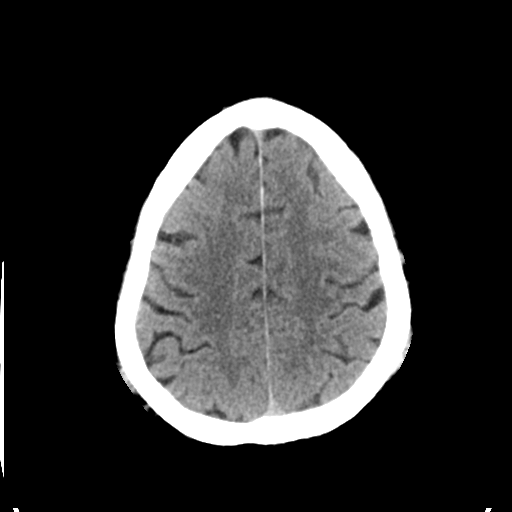
[im 23/30  bone]
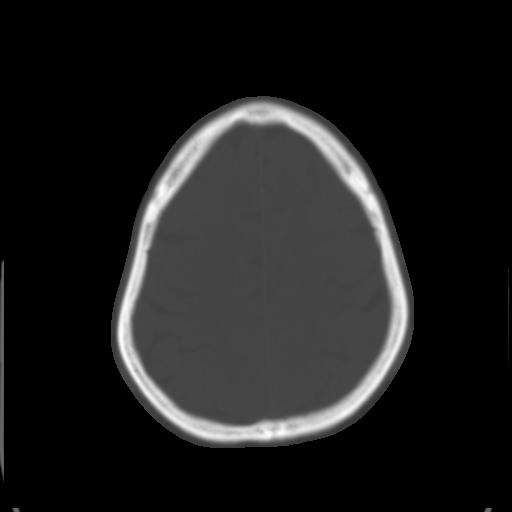
[im 25/30  brain]
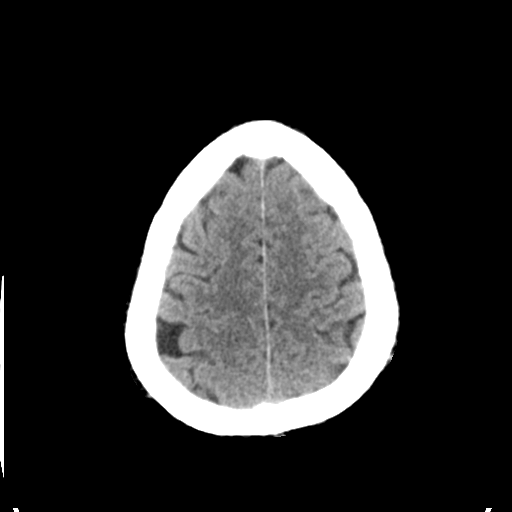
[im 27/30  brain]
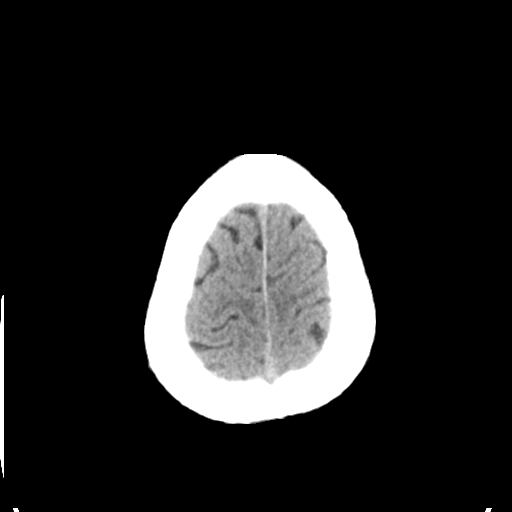
[im 29/30  brain]
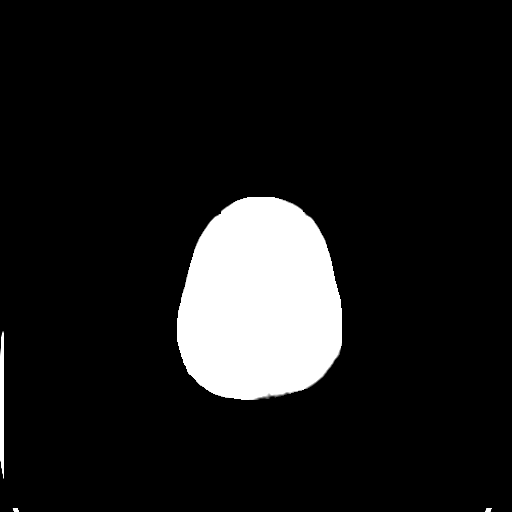

[16 of 30 positions shown; findings below may reference images not displayed]

FINDINGS: Mild volume loss/atrophy which may be slightly age advanced. No
acute intracranial abnormality. Specifically, no hemorrhage,
hydrocephalus, mass lesion, acute infarction, or significant
intracranial injury. No acute calvarial abnormality. Visualized
paranasal sinuses and mastoids clear. Orbital soft tissues
unremarkable.
IMPRESSION: Mild age advanced atrophy.  No acute intracranial abnormality.
